# Patient Record
Sex: Male | Born: 1935 | Hispanic: No | Marital: Married | Smoking: Smoker, current status unknown
Health system: Southern US, Community
[De-identification: ages and names within clinical notes are randomized; demographics above are authoritative.]

## PROBLEM LIST (undated history)

## (undated) DIAGNOSIS — I2510 Atherosclerotic heart disease of native coronary artery without angina pectoris: Secondary | ICD-10-CM

## (undated) DIAGNOSIS — E0590 Thyrotoxicosis, unspecified without thyrotoxic crisis or storm: Secondary | ICD-10-CM

## (undated) DIAGNOSIS — N401 Enlarged prostate with lower urinary tract symptoms: Secondary | ICD-10-CM

## (undated) DIAGNOSIS — S129XXA Fracture of neck, unspecified, initial encounter: Secondary | ICD-10-CM

## (undated) HISTORY — DX: Fracture of neck, unspecified, initial encounter: S12.9XXA

## (undated) HISTORY — DX: Atherosclerotic heart disease of native coronary artery without angina pectoris: I25.10

## (undated) HISTORY — DX: Thyrotoxicosis, unspecified without thyrotoxic crisis or storm: E05.90

## (undated) HISTORY — DX: Benign prostatic hyperplasia with lower urinary tract symptoms: N40.1

---
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
## 1996-07-31 HISTORY — PX: OTHER SURGICAL HISTORY: SHX169

## 1999-04-27 ENCOUNTER — Emergency Department (HOSPITAL_COMMUNITY): Admission: EM | Admit: 1999-04-27 | Discharge: 1999-04-27 | Payer: Self-pay | Admitting: Emergency Medicine

## 1999-04-27 ENCOUNTER — Encounter: Payer: Self-pay | Admitting: Emergency Medicine

## 2004-07-31 LAB — HM COLONOSCOPY: HM Colonoscopy: NORMAL

## 2006-07-31 HISTORY — PX: CHOLECYSTECTOMY: SHX55

## 2010-05-30 ENCOUNTER — Encounter: Payer: Self-pay | Admitting: Endocrinology

## 2010-06-27 ENCOUNTER — Ambulatory Visit: Payer: Self-pay | Admitting: Endocrinology

## 2010-06-27 DIAGNOSIS — I2510 Atherosclerotic heart disease of native coronary artery without angina pectoris: Secondary | ICD-10-CM | POA: Insufficient documentation

## 2010-06-27 DIAGNOSIS — N401 Enlarged prostate with lower urinary tract symptoms: Secondary | ICD-10-CM

## 2010-06-27 DIAGNOSIS — N138 Other obstructive and reflux uropathy: Secondary | ICD-10-CM

## 2010-06-27 DIAGNOSIS — S129XXA Fracture of neck, unspecified, initial encounter: Secondary | ICD-10-CM | POA: Insufficient documentation

## 2010-06-27 DIAGNOSIS — E0590 Thyrotoxicosis, unspecified without thyrotoxic crisis or storm: Secondary | ICD-10-CM

## 2010-06-27 HISTORY — DX: Atherosclerotic heart disease of native coronary artery without angina pectoris: I25.10

## 2010-06-27 HISTORY — DX: Other obstructive and reflux uropathy: N13.8

## 2010-06-27 HISTORY — DX: Fracture of neck, unspecified, initial encounter: S12.9XXA

## 2010-06-27 HISTORY — DX: Thyrotoxicosis, unspecified without thyrotoxic crisis or storm: E05.90

## 2010-06-27 HISTORY — DX: Benign prostatic hyperplasia with lower urinary tract symptoms: N40.1

## 2010-06-27 LAB — CONVERTED CEMR LAB
Free T4: 0.34 ng/dL — ABNORMAL LOW (ref 0.60–1.60)
TSH: 18.46 microintl units/mL — ABNORMAL HIGH (ref 0.35–5.50)

## 2010-08-16 ENCOUNTER — Other Ambulatory Visit: Payer: Self-pay | Admitting: Endocrinology

## 2010-08-16 ENCOUNTER — Ambulatory Visit
Admission: RE | Admit: 2010-08-16 | Discharge: 2010-08-16 | Payer: Self-pay | Source: Home / Self Care | Attending: Endocrinology | Admitting: Endocrinology

## 2010-08-16 LAB — TSH: TSH: 26.15 u[IU]/mL — ABNORMAL HIGH (ref 0.35–5.50)

## 2010-08-16 LAB — T4, FREE: Free T4: 0.37 ng/dL — ABNORMAL LOW (ref 0.60–1.60)

## 2010-08-30 ENCOUNTER — Ambulatory Visit
Admission: RE | Admit: 2010-08-30 | Discharge: 2010-08-30 | Payer: Self-pay | Source: Home / Self Care | Attending: Endocrinology | Admitting: Endocrinology

## 2010-08-30 ENCOUNTER — Other Ambulatory Visit: Payer: Self-pay | Admitting: Endocrinology

## 2010-08-30 LAB — TSH: TSH: 1.4 u[IU]/mL (ref 0.35–5.50)

## 2010-08-30 LAB — T4, FREE: Free T4: 0.77 ng/dL (ref 0.60–1.60)

## 2010-09-01 NOTE — Assessment & Plan Note (Signed)
Summary: New Endo/ thyroid/[employer]/Chao,roberto/cd   Vital Signs:  Patient profile:   77 year old male Height:      73 inches (185.42 cm) Weight:      134 pounds (60.91 kg) BMI:     17.74 O2 Sat:      97 % on Room air Temp:     97.8 degrees F (36.56 degrees C) oral Pulse rate:   72 / minute BP sitting:   130 / 68  (left arm) Cuff size:   regular  Vitals Entered By: Brenton Grills CMA Duncan Dull) (June 27, 2010 2:20 PM)  O2 Flow:  Room air CC: New Endo Consult/Thyroid/aj Is Patient Diabetic? No   Referring Adilen Pavelko:  Debroah Baller MD Primary [place]ra Rogacki:  Dr. Ardelle Park  CC:  New Endo Consult/Thyroid/aj.  History of Present Illness: pt says he was dx'ed with hyperthyroidism approx 1980, when he presented with weight loss.  he took medication for 1 year or so, and regained the weight.  in 1999, he again lost weight, but pt is unaware of any thyroid dx then.  he had pneumonia jan 2011 ([facility]).  then, he was told his thyroid was overactive, and he was rx'ed ptu 2x50 mg two times a day.  he says he has consistently taken the medication since then, but is unaware of having had more thyroid blood tests since then.   symptomatically, pt states few mos of moderate headache at the bimaxillary areas, in the context of nasal congestion, and persistent weight loss.  pt says he wishes to continue medical rx of hyperthyroidism indefinitely.    Current Medications (verified): 1)  Tamsulosin Hcl 0.4 Mg Caps (Tamsulosin Hcl) .Marland Kitchen.. 1 Capsule By Mouth Once Daily 2)  Propylthiouracil 50 Mg Tabs (Propylthiouracil) .... 2 Tablets By Mouth Every 12 Hours  Allergies (verified): No Known Drug Allergies  Past History:  Past Medical History: CAD (ICD-414.00) VERTEBRAL FRACTURE, CERVICAL SPINE (ICD-805.00) BENIGN PROSTATIC HYPERTROPHY, WITH OBSTRUCTION (ICD-600.01) HYPERTHYROIDISM (ICD-242.90)  Past Surgical History: Appendectomy (1610) Cholecystectomy (2008) Tonsillectomy (1958) Triple Bypass  Surgery (1998)  Family History: Reviewed history and no changes required. Family History Hypertension Family History Lung cancer  Social History: Reviewed history and no changes required. 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         denies hoarseness, double vision, palpitations, sob, polyuria, myalgias, excessive diaphoresis, numbness, tremor, anxiety, bruising, rhinorrhea, and muscle weakness.  he has doe easy bruising, and hypoglycemia.  he attributes constipation to narcotics.    Physical Exam  General:  normal appearance.   Head:  head: no deformity eyes: no periorbital swelling, no proptosis external nose and ears are normal mouth: no lesion seen Neck:  i do not appreciate a goiter Lungs:  Clear to auscultation bilaterally. Normal respiratory effort.  Heart:  Regular rate and rhythm without murmurs or gallops noted. Normal S1,S2.   Msk:  muscle bulk and strength are grossly normal.  no obvious joint swelling.  gait is normal and steady Extremities:  no deformity no edema Neurologic:  cn 2-12 grossly intact.   readily moves all 4's.   no tremor Skin:  normal texture and temp.  no rash.  not diaphoretic  Cervical Nodes:  No significant adenopathy.  Psych:  Alert and cooperative; normal mood and affect; normal attention span and concentration.   Additional Exam:  FastTSH              [  H]  18.46 uIU/mL                0.35-5.50 Free T4              [L]  0.34 ng/dL       Impression & Recommendations:  Problem # 1:  HYPERTHYROIDISM (ICD-242.90) overcontrolled  Problem # 2:  constipation is prob due to narcotics  Problem # 3:  VERTEBRAL FRACTURE, CERVICAL SPINE (ICD-805.00) he is at [facility] for osteoporosis, in view of #1  Medications Added to Medication List This Visit: 1)  Tamsulosin Hcl 0.4 Mg Caps (Tamsulosin hcl) .Marland Kitchen.. 1 capsule by mouth once daily 2)   Propylthiouracil 50 Mg Tabs (Propylthiouracil) .... 2 tablets by mouth every 12 hours 3)  Methimazole 10 Mg Tabs (Methimazole) .Marland Kitchen.. 1 tab once daily  Other Orders: TLB-TSH (Thyroid Stimulating Hormone) (84443-TSH) TLB-T4 (Thyrox), Free [phone]) Consultation Level IV (40981)  Patient Instructions: 1)  blood tests are being ordered for you today.  please call [phone] to hear your test results. 2)  if ever you have fever while taking this medication, stop it and call us, because of the risk of a rare side-effect. 3)  Please schedule a follow-up appointment in 3 months. 4)  (update: i left message on phone-tree:  stop ptu.  in 2 weeks, start tapazole 10 mg once daily.) Prescriptions: METHIMAZOLE 10 MG TABS (METHIMAZOLE) 1 tab once daily  #30 x 3   Entered and Authorized by:   Minus Breeding MD   Signed by:   Minus Breeding MD on 06/27/2010   Method used:   Electronically to        [facility] (retail)       [address]       [place], [place]  [address]       Ph: [phone]       Fax: [phone]   RxID:   [id number]    Orders Added: 1)  TLB-TSH (Thyroid Stimulating Hormone) [84443-TSH] 2)  TLB-T4 Elna Breslow), Free [[id number]] 3)  Consultation Level IV [87564]   Immunization History:  Pneumovax Immunization History:    Pneumovax:  historical (07/31/2009)   Immunization History:  Pneumovax Immunization History:    Pneumovax:  Historical (07/31/2009)

## 2010-09-01 NOTE — Assessment & Plan Note (Signed)
Summary: discuss meds/ nws   Vital Signs:  Patient profile:   77 year old male Height:      73 inches (185.42 cm) Weight:      139.38 pounds (63.35 kg) BMI:     18.46 O2 Sat:      98 % on Room air Temp:     97.6 degrees F (36.44 degrees C) oral Pulse rate:   67 / minute Pulse rhythm:   regular BP sitting:   128 / 78  (left arm) Cuff size:   regular  Vitals Entered By: Brenton Grills CMA Duncan Dull) (August 16, 2010 2:23 PM)  O2 Flow:  Room air CC: Discuss medications/aj Is Patient Diabetic? No   Referring Provider:  Debroah Baller MD Primary Provider:  Dr. Ardelle Park  CC:  Discuss medications/aj.  History of Present Illness: since on tapazole 10 mg once daily, he continues to have fatigue, but is slightly improved since he has been on reduced thionamide dosage.   pt says he wishes to continue medical rx of hyperthyroidism indefinitely.    Current Medications (verified): 1)  Tamsulosin Hcl 0.4 Mg Caps (Tamsulosin Hcl) .Marland Kitchen.. 1 Capsule By Mouth Once Daily 2)  Methimazole 10 Mg Tabs (Methimazole) .Marland Kitchen.. 1 Tab Once Daily  Allergies (verified): No Known Drug Allergies  Past History:  Past Medical History: Last updated: 06/27/2010 CAD (ICD-414.00) VERTEBRAL FRACTURE, CERVICAL SPINE (ICD-805.00) BENIGN PROSTATIC HYPERTROPHY, WITH OBSTRUCTION (ICD-600.01) HYPERTHYROIDISM (ICD-242.90)  Review of Systems  The patient denies fever.    Physical Exam  General:  normal appearance.   Neck:  i do not appreciate a goiter Neurologic:  no tremor Skin:  not diaphoretic Additional Exam:  FastTSH              [H]  26.15 uIU/mL                0.35-5.50 Free T4              [L]  0.37 ng/dL     Impression & Recommendations:  Problem # 1:  HYPERTHYROIDISM (ICD-242.90) overcontrolled, despite an decrease of his tapazole  Other Orders: TLB-TSH (Thyroid Stimulating Hormone) (84443-TSH) TLB-T4 (Thyrox), Free [phone]) Est. Patient Level III (40981)  Patient Instructions: 1)  blood tests  are being ordered for you today.  please call [phone] to hear your test results. 2)  pending the test results, please continue the same methimazole for now. 3)  Please schedule a follow-up appointment in 3 months. 4)  if ever you have fever while taking this medication, stop it and call us, because of the risk of a rare side-effect. 5)  (update: i left message on phone-tree:  stop tapazole).   Orders Added: 1)  TLB-TSH (Thyroid Stimulating Hormone) [84443-TSH] 2)  TLB-T4 (Thyrox), Free [95621-HY8M] 3)  Est. Patient Level III [57846]   Immunization History:  Influenza Immunization History:    Influenza:  historical (04/30/2010)   Immunization History:  Influenza Immunization History:    Influenza:  Historical (04/30/2010)   Preventive Care Screening  Colonoscopy:    Date:  07/31/2004    Results:  normal

## 2010-09-01 NOTE — Letter (Signed)
Summary: [facility]   Imported By: Lennie Odor 06/29/2010 15:04:18  _____________________________________________________________________  External Attachment:    Type:   Image     Comment:   External Document

## 2010-09-07 NOTE — Assessment & Plan Note (Signed)
Summary: PROBLEMS WITH THYROID /NWS   Vital Signs:  Patient profile:   77 year old male Height:      73 inches (185.42 cm) Weight:      136.25 pounds (61.93 kg) BMI:     18.04 O2 Sat:      88 % on Room air Temp:     97.9 degrees F (36.61 degrees C) oral Pulse rate:   82 / minute BP sitting:   128 / 78  (left arm) Cuff size:   regular  Vitals Entered By: Brenton Grills CMA Duncan Dull) (August 30, 2010 3:18 PM)  O2 Flow:  Room air CC: Follow up on Thyroid/aj Is Patient Diabetic? No   Referring Provider:  Debroah Baller MD Primary Provider:  Dr. Ardelle Park  CC:  Follow up on Thyroid/aj.  History of Present Illness: pt says he developed swelling at the anterior neck, after he stopped the tapazole.  he therefore resumed the tapazole.    Current Medications (verified): 1)  Tamsulosin Hcl 0.4 Mg Caps (Tamsulosin Hcl) .Marland Kitchen.. 1 Capsule By Mouth Once Daily  Allergies (verified): No Known Drug Allergies  Past History:  Past Medical History: Last updated: 06/27/2010 CAD (ICD-414.00) VERTEBRAL FRACTURE, CERVICAL SPINE (ICD-805.00) BENIGN PROSTATIC HYPERTROPHY, WITH OBSTRUCTION (ICD-600.01) HYPERTHYROIDISM (ICD-242.90)  Review of Systems  The patient denies fever.    Physical Exam  General:  normal appearance.   Neck:  i do not appreciate a goiter Neurologic:  no tremor Skin:  not diaphoretic Additional Exam:  FastTSH                   1.40 uIU/mL                 0.35-5.50 Free T4                   0.77 ng/dL     Impression & Recommendations:  Problem # 1:  HYPERTHYROIDISM (ICD-242.90) overcontrolled  Other Orders: TLB-TSH (Thyroid Stimulating Hormone) (84443-TSH) TLB-T4 (Thyrox), Free [phone]) Est. Patient Level III (40981)  Patient Instructions: 1)  blood tests are being ordered for you today.  please call [phone] to hear your test results. 2)  Please schedule a follow-up appointment in 3 months. 3)  (update: i left message on phone-tree:  stay-off tapazole.  ret 1  month)   Orders Added: 1)  TLB-TSH (Thyroid Stimulating Hormone) [84443-TSH] 2)  TLB-T4 (Thyrox), Free [95621-HY8M] 3)  Est. Patient Level III [57846]

## 2010-11-15 ENCOUNTER — Ambulatory Visit: Payer: Self-pay | Admitting: Endocrinology

## 2010-11-15 DIAGNOSIS — Z0289 Encounter for other administrative examinations: Secondary | ICD-10-CM

## 2011-05-11 ENCOUNTER — Ambulatory Visit
Admission: RE | Admit: 2011-05-11 | Discharge: 2011-05-11 | Disposition: A | Discharge status: Home / Self Care | Payer: Medicare HMO | Source: Ambulatory Visit | Attending: Radiation Oncology | Admitting: Radiation Oncology

## 2011-05-11 DIAGNOSIS — Z951 Presence of aortocoronary bypass graft: Secondary | ICD-10-CM | POA: Insufficient documentation

## 2011-05-11 DIAGNOSIS — Z79899 Other long term (current) drug therapy: Secondary | ICD-10-CM | POA: Insufficient documentation

## 2011-05-11 DIAGNOSIS — C61 Malignant neoplasm of prostate: Secondary | ICD-10-CM | POA: Insufficient documentation

## 2011-11-02 ENCOUNTER — Other Ambulatory Visit: Payer: Self-pay | Admitting: Unknown Physician Specialty

## 2011-11-02 DIAGNOSIS — E0590 Thyrotoxicosis, unspecified without thyrotoxic crisis or storm: Secondary | ICD-10-CM

## 2011-11-13 ENCOUNTER — Encounter (HOSPITAL_COMMUNITY)
Admission: RE | Admit: 2011-11-13 | Discharge: 2011-11-13 | Disposition: A | Discharge status: Home / Self Care | Payer: Medicare HMO | Source: Ambulatory Visit | Attending: Family Medicine | Admitting: Family Medicine

## 2011-11-13 DIAGNOSIS — E0590 Thyrotoxicosis, unspecified without thyrotoxic crisis or storm: Secondary | ICD-10-CM | POA: Insufficient documentation

## 2011-11-14 ENCOUNTER — Encounter (HOSPITAL_COMMUNITY)
Admission: RE | Admit: 2011-11-14 | Discharge: 2011-11-14 | Disposition: A | Discharge status: Home / Self Care | Payer: Medicare HMO | Source: Ambulatory Visit | Attending: Family Medicine | Admitting: Family Medicine

## 2011-11-14 MED ORDER — SODIUM PERTECHNETATE TC 99M INJECTION
10.0000 | Freq: Once | INTRAVENOUS | Status: AC | PRN
  Administered 2011-11-14: 10 via INTRAVENOUS

## 2011-11-14 MED ORDER — SODIUM IODIDE I 131 CAPSULE
11.0000 | Freq: Once | INTRAVENOUS | Status: AC | PRN
  Administered 2011-11-13: 11 via ORAL

## 2011-11-15 ENCOUNTER — Other Ambulatory Visit: Payer: Self-pay | Admitting: Unknown Physician Specialty

## 2011-11-15 DIAGNOSIS — E0590 Thyrotoxicosis, unspecified without thyrotoxic crisis or storm: Secondary | ICD-10-CM

## 2011-11-22 ENCOUNTER — Encounter (HOSPITAL_COMMUNITY)
Admission: RE | Admit: 2011-11-22 | Discharge: 2011-11-22 | Disposition: A | Discharge status: Home / Self Care | Payer: Medicare HMO | Source: Ambulatory Visit | Attending: Family Medicine | Admitting: Family Medicine

## 2011-11-22 DIAGNOSIS — E0590 Thyrotoxicosis, unspecified without thyrotoxic crisis or storm: Secondary | ICD-10-CM

## 2011-11-22 MED ORDER — SODIUM IODIDE I 131 CAPSULE
35.8000 | Freq: Once | INTRAVENOUS | Status: AC | PRN
  Administered 2011-11-22: 35.8 via ORAL

## 2012-01-16 ENCOUNTER — Encounter: Payer: Self-pay | Admitting: *Deleted

## 2012-03-05 ENCOUNTER — Ambulatory Visit (INDEPENDENT_AMBULATORY_CARE_PROVIDER_SITE_OTHER): Payer: Medicare HMO | Admitting: Endocrinology

## 2012-03-05 ENCOUNTER — Other Ambulatory Visit (INDEPENDENT_AMBULATORY_CARE_PROVIDER_SITE_OTHER): Payer: Medicare HMO

## 2012-03-05 VITALS — BP 170/82 | HR 80 | Temp 98.4°F | Resp 14 | Wt 120.0 lb

## 2012-03-05 DIAGNOSIS — E0590 Thyrotoxicosis, unspecified without thyrotoxic crisis or storm: Secondary | ICD-10-CM

## 2012-03-05 NOTE — Patient Instructions (Addendum)
blood tests are being requested for you today.  You will receive a letter with results. Please come back for a follow-up appointment for 1 month.   If the thyroid is still overactive, you can take the medication while the radioactive iodine is working.

## 2012-03-05 NOTE — Progress Notes (Signed)
  Subjective:    Patient ID: Nathan Haley, male    DOB: 1929-02-04, 78 y.o.   MRN: [id number]  HPI pt says he was dx'ed with hyperthyroidism approx 1980, when he presented with weight loss.  he took medication for 1 year or so, and regained the weight.  in 1999, he again lost weight, but pt is unaware of any thyroid dx then.  He was again rx'ed with ptu in 2011 for hyperthyroidism, but ptu was stopped again due to normal TFT.  He underwent i-131 rx for recurrent hyperthyroidism, 3 1/2 months ago.  His chief complaints are recurrent weight loss, and generalized weakness.    Past Medical History  Diagnosis Date  . CAD 06/27/2010  . VERTEBRAL FRACTURE, CERVICAL SPINE 06/27/2010  . BENIGN PROSTATIC HYPERTROPHY, WITH OBSTRUCTION 06/27/2010  . HYPERTHYROIDISM 06/27/2010    Past Surgical History  Procedure Date  . Appendectomy 1950  . Cholecystectomy 2008  . Tonsillectomy 1958  . Triple bypass surgery 1998    History   Social History  . Marital Status: Married    Spouse Name: N/A    Number of Children: N/A  . Years of Education: N/A   Occupational History  .      Retired   Social History Main Topics  . Smoking status: Smoker, Current Status Unknown  . Smokeless tobacco: Not on file  . Alcohol Use: Yes  . Drug Use:   . Sexually Active:    Other Topics Concern  . Not on file   Social History Narrative  . No narrative on file    Current Outpatient Prescriptions on File Prior to Visit  Medication Sig Dispense Refill  . methimazole (TAPAZOLE) 10 MG tablet Take 10 mg by mouth 2 (two) times daily.        No Known Allergies  Family History  Problem Relation Age of Onset  . Hypertension Other   . Cancer Other     Lung Cancer    BP 170/82  Pulse 80  Temp 98.4 F (36.9 C) (Oral)  Resp 14  Wt 120 lb (54.432 kg)  SpO2 96%  Review of Systems Denies fever.     Objective:   Physical Exam Vital signs: see vs page Gen: elderly, frail, no distress NECK: There is  no palpable thyroid enlargement.  No thyroid nodule is palpable.  No palpable lymphadenopathy at the anterior neck.   Lab Results  Component Value Date   TSH 33.44* 03/05/2012      Assessment & Plan:  Hypothyroidism, due to i-31 vs tapazole vs both, new

## 2012-03-06 ENCOUNTER — Other Ambulatory Visit: Payer: Self-pay | Admitting: Endocrinology

## 2012-03-06 MED ORDER — LEVOTHYROXINE SODIUM 75 MCG PO TABS: 75.0000 ug | ORAL_TABLET | Freq: Every day | ORAL | Status: DC

## 2012-03-06 NOTE — Telephone Encounter (Signed)
please call patient: Also, please start a pill for the low thyroid.  i have sent a prescription to your pharmacy.

## 2012-03-07 NOTE — Telephone Encounter (Signed)
Called pt to inform of new rx for thyroid, no answer/unable to leave message.

## 2012-03-07 NOTE — Telephone Encounter (Signed)
Pt informed of new rx for thyroid.

## 2012-04-29 ENCOUNTER — Encounter: Payer: Self-pay | Admitting: Endocrinology

## 2012-04-29 ENCOUNTER — Ambulatory Visit (INDEPENDENT_AMBULATORY_CARE_PROVIDER_SITE_OTHER): Payer: Medicare HMO | Admitting: Endocrinology

## 2012-04-29 ENCOUNTER — Other Ambulatory Visit (INDEPENDENT_AMBULATORY_CARE_PROVIDER_SITE_OTHER): Payer: Medicare HMO

## 2012-04-29 VITALS — BP 146/80 | HR 52 | Temp 97.9°F | Resp 16 | Ht 72.0 in | Wt 123.2 lb

## 2012-04-29 DIAGNOSIS — E0590 Thyrotoxicosis, unspecified without thyrotoxic crisis or storm: Secondary | ICD-10-CM

## 2012-04-29 LAB — TSH: TSH: 75.07 u[IU]/mL — ABNORMAL HIGH (ref 0.35–5.50)

## 2012-04-29 NOTE — Patient Instructions (Addendum)
blood tests are being requested for you today.  You will receive a letter with results. Please come back for a follow-up appointment in 6 weeks  

## 2012-04-29 NOTE — Progress Notes (Signed)
  Subjective:    Patient ID: Nathan Haley, male    DOB: Sep 06, 1928, 78 y.o.   MRN: [id number]  HPI pt says he was dx'ed with hyperthyroidism approx 1980, when he presented with weight loss.  he took medication for 1 year or so, and regained the weight.  in 1999, he again lost weight, but pt is unaware of any thyroid dx then.  He was again rx'ed with ptu in 2011 for hyperthyroidism, but ptu was stopped again due to normal TFT.  He underwent i-131 rx for recurrent hyperthyroidism, 5 months ago.  He was rx'ed tapazole while the i-131 was working, but he was hypothyroid when he was last seen, so it was stopped.   He now takes synthroid.  His main symptom is urinary retention, for which he sees urology.    Past Medical History  Diagnosis Date  . CAD 06/27/2010  . VERTEBRAL FRACTURE, CERVICAL SPINE 06/27/2010  . BENIGN PROSTATIC HYPERTROPHY, WITH OBSTRUCTION 06/27/2010  . HYPERTHYROIDISM 06/27/2010    Past Surgical History  Procedure Date  . Appendectomy 1950  . Cholecystectomy 2008  . Tonsillectomy 1958  . Triple bypass surgery 1998    History   Social History  . Marital Status: Married    Spouse Name: N/A    Number of Children: N/A  . Years of Education: N/A   Occupational History  .      Retired   Social History Main Topics  . Smoking status: Smoker, Current Status Unknown  . Smokeless tobacco: Not on file  . Alcohol Use: Yes  . Drug Use:   . Sexually Active:    Other Topics Concern  . Not on file   Social History Narrative  . No narrative on file    Current Outpatient Prescriptions on File Prior to Visit  Medication Sig Dispense Refill  . levothyroxine (SYNTHROID, LEVOTHROID) 75 MCG tablet Take 1 tablet (75 mcg total) by mouth daily.  30 tablet  1  . methimazole (TAPAZOLE) 10 MG tablet Take 10 mg by mouth 2 (two) times daily.        No Known Allergies  Family History  Problem Relation Age of Onset  . Hypertension Other   . Cancer Other     Lung Cancer     BP 146/80  Pulse 52  Temp 97.9 F (36.6 C) (Oral)  Resp 16  Ht 6' (1.829 m)  Wt 123 lb 3 oz (55.877 kg)  BMI 16.71 kg/m2  SpO2 98%  Review of Systems Denies weight change    Objective:   Physical Exam VITAL SIGNS:  See vs page GENERAL: no distress NECK: There is no palpable thyroid enlargement.  No thyroid nodule is palpable.  No palpable lymphadenopathy at the anterior neck.      Assessment & Plan:  Post-i-131 hypothyroidism, on replacement

## 2012-04-30 ENCOUNTER — Other Ambulatory Visit: Payer: Self-pay | Admitting: Endocrinology

## 2012-04-30 MED ORDER — LEVOTHYROXINE SODIUM 150 MCG PO TABS: 150.0000 ug | ORAL_TABLET | Freq: Every day | ORAL | Status: DC

## 2012-05-02 ENCOUNTER — Other Ambulatory Visit: Payer: Self-pay | Admitting: General Practice

## 2012-05-02 ENCOUNTER — Telehealth: Payer: Self-pay | Admitting: Endocrinology

## 2012-05-02 MED ORDER — LEVOTHYROXINE SODIUM 150 MCG PO TABS: 150.0000 ug | ORAL_TABLET | Freq: Every day | ORAL | Status: DC

## 2012-05-02 NOTE — Telephone Encounter (Signed)
Called Pt to advise of new Rx, VM not set up to leave message.

## 2012-05-02 NOTE — Telephone Encounter (Signed)
Pt's levothyroxine rx was sent to wrong pharmacy. Correct pharmacy is [facility], not [facility] on [address]. Please correct and advise patient.

## 2012-06-11 ENCOUNTER — Ambulatory Visit: Payer: Medicare HMO | Admitting: Internal Medicine

## 2012-06-19 ENCOUNTER — Ambulatory Visit (INDEPENDENT_AMBULATORY_CARE_PROVIDER_SITE_OTHER): Payer: Medicare HMO | Admitting: Endocrinology

## 2012-06-19 ENCOUNTER — Encounter: Payer: Self-pay | Admitting: Endocrinology

## 2012-06-19 VITALS — BP 142/80 | HR 66 | Temp 97.7°F | Wt 124.0 lb

## 2012-06-19 DIAGNOSIS — E890 Postprocedural hypothyroidism: Secondary | ICD-10-CM

## 2012-06-19 LAB — TSH: TSH: 12.748 u[IU]/mL — ABNORMAL HIGH (ref 0.350–4.500)

## 2012-06-19 NOTE — Progress Notes (Signed)
  Subjective:    Patient ID: Nathan Haley, male    DOB: [birth year], 78 y.o.   MRN: [id number]  HPI pt says he was dx'ed with hyperthyroidism approx 1980, when he presented with weight loss.  he took medication for 1 year or so, and regained the weight.  in 1999, he again lost weight, but pt is unaware of any thyroid dx then.  He was again rx'ed with ptu in 2011 for hyperthyroidism, but ptu was stopped again due to normal TFT.  He was found on nuclear med scan to have grave's dz.  He underwent i-131 rx for recurrent hyperthyroidism, in April, 2013.  He was rx'ed tapazole while the i-131 was working, but it was stopped when he developed hypothyroidism.   He now takes synthroid 150 mcg/day.  pt states he feels well in general, except for fatigue.   Past Medical History  Diagnosis Date  . CAD 06/27/2010  . VERTEBRAL FRACTURE, CERVICAL SPINE 06/27/2010  . BENIGN PROSTATIC HYPERTROPHY, WITH OBSTRUCTION 06/27/2010  . HYPERTHYROIDISM 06/27/2010    Past Surgical History  Procedure Date  . Appendectomy 1950  . Cholecystectomy 2008  . Tonsillectomy 1958  . Triple bypass surgery 1998    History   Social History  . Marital Status: Married    Spouse Name: N/A    Number of Children: N/A  . Years of Education: N/A   Occupational History  .      Retired   Social History Main Topics  . Smoking status: Smoker, Current Status Unknown  . Smokeless tobacco: Not on file  . Alcohol Use: Yes  . Drug Use:   . Sexually Active:    Other Topics Concern  . Not on file   Social History Narrative  . No narrative on file    Current Outpatient Prescriptions on File Prior to Visit  Medication Sig Dispense Refill  . predniSONE (DELTASONE) 5 MG tablet         No Known Allergies  Family History  Problem Relation Age of Onset  . Hypertension Other   . Cancer Other     Lung Cancer    BP 142/80  Pulse 66  Temp 97.7 F (36.5 C) (Oral)  Wt 124 lb (56.246 kg)  SpO2 93%  Review of  Systems Denies weight change    Objective:   Physical Exam VITAL SIGNS:  See vs page GENERAL: no distress NECK: There is no palpable thyroid enlargement.  No thyroid nodule is palpable.  No palpable lymphadenopathy at the anterior neck.    Lab Results  Component Value Date   TSH 12.748* 06/19/2012      Assessment & Plan:  Post-i-131 hypothyroidism, needs increased rx.

## 2012-06-19 NOTE — Patient Instructions (Addendum)
blood tests are being requested for you today.  We'll contact you with results.   Please come back for a follow-up appointment in 3 months.    

## 2012-06-20 MED ORDER — LEVOTHYROXINE SODIUM 175 MCG PO TABS: 175.0000 ug | ORAL_TABLET | Freq: Every day | ORAL | Status: DC

## 2012-12-27 ENCOUNTER — Ambulatory Visit: Payer: Medicare HMO | Admitting: Endocrinology

## 2012-12-31 ENCOUNTER — Encounter: Payer: Self-pay | Admitting: Endocrinology

## 2012-12-31 ENCOUNTER — Ambulatory Visit (INDEPENDENT_AMBULATORY_CARE_PROVIDER_SITE_OTHER): Payer: Medicare HMO | Admitting: Endocrinology

## 2012-12-31 VITALS — BP 122/70 | HR 55 | Ht 71.0 in | Wt 121.0 lb

## 2012-12-31 DIAGNOSIS — E890 Postprocedural hypothyroidism: Secondary | ICD-10-CM

## 2012-12-31 MED ORDER — LEVOTHYROXINE SODIUM 175 MCG PO TABS: 175.0000 ug | ORAL_TABLET | Freq: Every day | ORAL | Status: DC

## 2012-12-31 NOTE — Patient Instructions (Addendum)
Please resume the levothyroxine.  i have sent a prescription to your pharmacy.   Please come back for a follow-up appointment in 6 weeks.

## 2012-12-31 NOTE — Progress Notes (Signed)
  Subjective:    Patient ID: Nathan Haley, male    DOB: May 27, 1929, 79 y.o.   MRN: [id number]  HPI pt says he was dx'ed with hyperthyroidism approx 1980, when he presented with weight loss.  he took medication for 1 year or so, and regained the weight.  in 1999, he again lost weight, but pt is unaware of any thyroid dx then.  He was again rx'ed with ptu in 2011 for hyperthyroidism, but ptu was stopped again due to normal TFT.  He was found on nuclear med scan to have grave's dz.  He underwent i-131 rx for recurrent hyperthyroidism, in April, 2013.  He was rx'ed tapazole while the i-131 was working, but it was stopped when he developed hypothyroidism.   In late 2013, synthroid was increased to 175 mcg/day.  pt states he feels well in general, except for ongoing fatigue. He did not notice any improvement in the fatigue with the increased synthroid.  He ran out of synthroid approx 3 weeks ago.   Past Medical History  Diagnosis Date  . CAD 06/27/2010  . VERTEBRAL FRACTURE, CERVICAL SPINE 06/27/2010  . BENIGN PROSTATIC HYPERTROPHY, WITH OBSTRUCTION 06/27/2010  . HYPERTHYROIDISM 06/27/2010    Past Surgical History  Procedure Laterality Date  . Appendectomy  1950  . Cholecystectomy  2008  . Tonsillectomy  1958  . Triple bypass surgery  1998    History   Social History  . Marital Status: Married    Spouse Name: N/A    Number of Children: N/A  . Years of Education: N/A   Occupational History  .      Retired   Social History Main Topics  . Smoking status: Smoker, Current Status Unknown  . Smokeless tobacco: Not on file  . Alcohol Use: Yes  . Drug Use:   . Sexually Active:    Other Topics Concern  . Not on file   Social History Narrative  . No narrative on file    Current Outpatient Prescriptions on File Prior to Visit  Medication Sig Dispense Refill  . predniSONE (DELTASONE) 5 MG tablet        No current facility-administered medications on file prior to visit.    No  Known Allergies  Family History  Problem Relation Age of Onset  . Hypertension Other   . Cancer Other     Lung Cancer    BP 122/70  Pulse 55  Ht 5\' 11"  (1.803 m)  Wt 121 lb (54.885 kg)  BMI 16.88 kg/m2  SpO2 93%  Review of Systems He would like to gain weight, but has been unable to do so.      Objective:   Physical Exam VITAL SIGNS:  See vs page GENERAL: no distress NECK: There is no palpable thyroid enlargement.  No thyroid nodule is palpable.  No palpable lymphadenopathy at the anterior neck.  Lab Results  Component Value Date   TSH 12.748* 06/19/2012      Assessment & Plan:  Post-i-131 hypothyroidism, therapy limited by noncompliance.  i'll do the best i can.

## 2013-02-11 ENCOUNTER — Ambulatory Visit: Payer: Medicare HMO | Admitting: Endocrinology

## 2013-02-11 DIAGNOSIS — Z0289 Encounter for other administrative examinations: Secondary | ICD-10-CM

## 2013-04-02 ENCOUNTER — Ambulatory Visit: Payer: Medicare HMO | Admitting: Endocrinology

## 2013-04-09 ENCOUNTER — Encounter: Payer: Self-pay | Admitting: Endocrinology

## 2013-04-09 ENCOUNTER — Ambulatory Visit (INDEPENDENT_AMBULATORY_CARE_PROVIDER_SITE_OTHER): Payer: Medicare HMO | Admitting: Endocrinology

## 2013-04-09 VITALS — BP 118/80 | HR 78 | Ht 72.0 in | Wt 112.0 lb

## 2013-04-09 DIAGNOSIS — E890 Postprocedural hypothyroidism: Secondary | ICD-10-CM

## 2013-04-09 LAB — TSH: TSH: 0.16 u[IU]/mL — ABNORMAL LOW (ref 0.35–5.50)

## 2013-04-09 NOTE — Progress Notes (Signed)
  Subjective:    Patient ID: Nathan Haley, male    DOB: 1929-05-22, 79 y.o.   MRN: [id number]  HPI pt says he was dx'ed with hyperthyroidism approx 1980, when he presented with weight loss.  he took medication for 1 year or so, and regained the weight.  in 1999, he again lost weight, but pt is unaware of any thyroid dx then.  He was again rx'ed with ptu in 2011 for hyperthyroidism, but ptu was stopped again due to normal TFT.  He was found on nuclear med scan to have grave's dz.  He underwent i-131 rx for recurrent hyperthyroidism, in April, 2013.  He was rx'ed tapazole while the i-131 was working, but it was stopped when he developed hypothyroidism.   In late 2013, synthroid was increased to its current 175 mcg/day.  He continues to lose weight.   Past Medical History  Diagnosis Date  . CAD 06/27/2010  . VERTEBRAL FRACTURE, CERVICAL SPINE 06/27/2010  . BENIGN PROSTATIC HYPERTROPHY, WITH OBSTRUCTION 06/27/2010  . HYPERTHYROIDISM 06/27/2010    Past Surgical History  Procedure Laterality Date  . Appendectomy  1950  . Cholecystectomy  2008  . Tonsillectomy  1958  . Triple bypass surgery  1998    History   Social History  . Marital Status: Married    Spouse Name: N/A    Number of Children: N/A  . Years of Education: N/A   Occupational History  .      Retired   Social History Main Topics  . Smoking status: Smoker, Current Status Unknown  . Smokeless tobacco: Not on file  . Alcohol Use: Yes  . Drug Use:   . Sexual Activity:    Other Topics Concern  . Not on file   Social History Narrative  . No narrative on file    Current Outpatient Prescriptions on File Prior to Visit  Medication Sig Dispense Refill  . predniSONE (DELTASONE) 5 MG tablet        No current facility-administered medications on file prior to visit.   No Known Allergies  Family History  Problem Relation Age of Onset  . Hypertension Other   . Cancer Other     Lung Cancer   BP 118/80  Pulse 78   Ht 6' (1.829 m)  Wt 112 lb (50.803 kg)  BMI 15.19 kg/m2  SpO2 98%  Review of Systems Denies tremor    Objective:   Physical Exam Vital signs: see vs page Gen: elderly, frail, no distress Neuro: no tremor Lab Results  Component Value Date   TSH 0.16* 04/09/2013      Assessment & Plan:  Post-i-131 hypothyroidism, slightly overreplaced

## 2013-04-09 NOTE — Patient Instructions (Addendum)
blood tests are being requested for you today.  We'll contact you with results. Please come back for a follow-up appointment in 4 months.    

## 2013-04-10 MED ORDER — LEVOTHYROXINE SODIUM 150 MCG PO TABS: 150.0000 ug | ORAL_TABLET | Freq: Every day | ORAL | Status: DC

## 2013-04-14 ENCOUNTER — Telehealth: Payer: Self-pay | Admitting: Endocrinology

## 2013-04-15 NOTE — Telephone Encounter (Signed)
Pt would like to know if you think it is best to have his thyroid removed, so he doesn't have any problems anymore

## 2013-04-15 NOTE — Telephone Encounter (Signed)
Pt advised and states an understanding 

## 2013-04-15 NOTE — Telephone Encounter (Signed)
No, your thyroid has been destroyed by the radioactive iodine. Please adjust the medication as we discussed. i hope you feel well.

## 2013-11-26 ENCOUNTER — Ambulatory Visit: Payer: Medicare HMO | Admitting: Endocrinology

## 2013-12-02 ENCOUNTER — Ambulatory Visit: Payer: Medicare HMO | Admitting: Endocrinology

## 2013-12-08 ENCOUNTER — Encounter: Payer: Self-pay | Admitting: Endocrinology

## 2013-12-08 ENCOUNTER — Ambulatory Visit (INDEPENDENT_AMBULATORY_CARE_PROVIDER_SITE_OTHER): Payer: Medicare HMO | Admitting: Endocrinology

## 2013-12-08 VITALS — BP 120/62 | HR 71 | Temp 98.1°F | Ht 72.0 in | Wt 109.0 lb

## 2013-12-08 DIAGNOSIS — E890 Postprocedural hypothyroidism: Secondary | ICD-10-CM

## 2013-12-08 LAB — T4, FREE: Free T4: 0.9 ng/dL (ref 0.60–1.60)

## 2013-12-08 LAB — TSH: TSH: 2.96 u[IU]/mL (ref 0.35–4.50)

## 2013-12-08 NOTE — Patient Instructions (Addendum)
blood tests are being requested for you today.  We'll contact you with results. Please come back for a follow-up appointment in 4 months.    

## 2013-12-08 NOTE — Progress Notes (Signed)
   Subjective:    Patient ID: Nathan Haley, male    DOB: 1929-02-19, 80 y.o.   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  He continues to lose weight.    Past Medical History  Diagnosis Date  . CAD 06/27/2010  . VERTEBRAL FRACTURE, CERVICAL SPINE 06/27/2010  . BENIGN PROSTATIC HYPERTROPHY, WITH OBSTRUCTION 06/27/2010  . HYPERTHYROIDISM 06/27/2010    Past Surgical History  Procedure Laterality Date  . Appendectomy  1950  . Cholecystectomy  2008  . Tonsillectomy  1958  . Triple bypass surgery  1998    History   Social History  . Marital Status: Married    Spouse Name: N/A    Number of Children: N/A  . Years of Education: N/A   Occupational History  .      Retired   Social History Main Topics  . Smoking status: Smoker, Current Status Unknown  . Smokeless tobacco: Not on file  . Alcohol Use: Yes  . Drug Use:   . Sexual Activity:    Other Topics Concern  . Not on file   Social History Narrative  . No narrative on file    Current Outpatient Prescriptions on File Prior to Visit  Medication Sig Dispense Refill  . levothyroxine (SYNTHROID, LEVOTHROID) 150 MCG tablet Take 1 tablet (150 mcg total) by mouth daily before breakfast.  30 tablet  5  . predniSONE (DELTASONE) 5 MG tablet        No current facility-administered medications on file prior to visit.    No Known Allergies  Family History  Problem Relation Age of Onset  . Hypertension  Other   . Cancer Other     Lung Cancer    BP 120/62  Pulse 71  Temp(Src) 98.1 F (36.7 C) (Oral)  Ht 6' (1.829 m)  Wt 109 lb (49.442 kg)  BMI 14.78 kg/m2  SpO2 96%  Review of Systems He has a tremor.  He denies excessive diaphoresis.      Objective:   Physical Exam Vital signs: see vs page Gen: elderly, frail, no distress NECK: There is no palpable thyroid enlargement.  No thyroid nodule is palpable.  No palpable lymphadenopathy at the anterior neck.    Lab Results  Component Value Date   TSH 2.96 12/08/2013      Assessment & Plan:  Post-i-131 hypothyroidism, well-replaced Weight-loss: not thyroid-related.

## 2014-01-27 ENCOUNTER — Telehealth: Payer: Self-pay | Admitting: Endocrinology

## 2014-01-27 MED ORDER — LEVOTHYROXINE SODIUM 150 MCG PO TABS: 150.0000 ug | ORAL_TABLET | Freq: Every day | ORAL | Status: AC

## 2014-01-27 NOTE — Telephone Encounter (Signed)
Pt needs refill on levothyroxin

## 2014-01-27 NOTE — Telephone Encounter (Signed)
Rx sent to pharmacy   

## 2014-04-13 ENCOUNTER — Ambulatory Visit: Payer: Medicare HMO | Admitting: Endocrinology

## 2014-05-31 DEATH — deceased
